# Patient Record
Sex: Female | Born: 1970 | Race: Black or African American | Hispanic: No | Marital: Married
Health system: Southern US, Community
[De-identification: ages and names within clinical notes are randomized; demographics above are authoritative.]

## PROBLEM LIST (undated history)

## (undated) HISTORY — PX: REDUCTION MAMMAPLASTY: SUR839

---

## 1998-09-22 ENCOUNTER — Other Ambulatory Visit: Admission: RE | Admit: 1998-09-22 | Discharge: 1998-09-22 | Payer: Self-pay | Admitting: Obstetrics and Gynecology

## 2000-01-19 ENCOUNTER — Other Ambulatory Visit: Admission: RE | Admit: 2000-01-19 | Discharge: 2000-01-19 | Payer: Self-pay | Admitting: Obstetrics and Gynecology

## 2004-10-23 ENCOUNTER — Emergency Department (HOSPITAL_COMMUNITY): Admission: EM | Admit: 2004-10-23 | Discharge: 2004-10-23 | Payer: Self-pay | Admitting: Family Medicine

## 2005-09-08 ENCOUNTER — Emergency Department (HOSPITAL_COMMUNITY): Admission: EM | Admit: 2005-09-08 | Discharge: 2005-09-08 | Payer: Self-pay | Admitting: Family Medicine

## 2006-10-26 ENCOUNTER — Emergency Department (HOSPITAL_COMMUNITY): Admission: EM | Admit: 2006-10-26 | Discharge: 2006-10-26 | Payer: Self-pay | Admitting: Emergency Medicine

## 2006-11-16 ENCOUNTER — Emergency Department (HOSPITAL_COMMUNITY): Admission: EM | Admit: 2006-11-16 | Discharge: 2006-11-16 | Payer: Self-pay | Admitting: Family Medicine

## 2007-03-10 ENCOUNTER — Emergency Department (HOSPITAL_COMMUNITY): Admission: EM | Admit: 2007-03-10 | Discharge: 2007-03-10 | Payer: Self-pay | Admitting: Emergency Medicine

## 2011-02-21 LAB — CULTURE, ROUTINE-ABSCESS: Gram Stain: NONE SEEN

## 2013-12-28 ENCOUNTER — Other Ambulatory Visit: Payer: Self-pay | Admitting: Endocrinology

## 2013-12-28 DIAGNOSIS — Z1231 Encounter for screening mammogram for malignant neoplasm of breast: Secondary | ICD-10-CM

## 2014-01-13 ENCOUNTER — Ambulatory Visit
Admission: RE | Admit: 2014-01-13 | Discharge: 2014-01-13 | Disposition: A | Discharge status: Home / Self Care | Payer: 59 | Source: Ambulatory Visit | Attending: Endocrinology | Admitting: Endocrinology

## 2014-01-13 DIAGNOSIS — Z1231 Encounter for screening mammogram for malignant neoplasm of breast: Secondary | ICD-10-CM

## 2015-11-10 ENCOUNTER — Other Ambulatory Visit: Payer: Self-pay | Admitting: Endocrinology

## 2015-11-10 ENCOUNTER — Other Ambulatory Visit: Payer: Self-pay | Admitting: Family Medicine

## 2015-11-10 DIAGNOSIS — Z1231 Encounter for screening mammogram for malignant neoplasm of breast: Secondary | ICD-10-CM

## 2015-12-02 ENCOUNTER — Ambulatory Visit
Admission: RE | Admit: 2015-12-02 | Discharge: 2015-12-02 | Disposition: A | Discharge status: Home / Self Care | Payer: 59 | Source: Ambulatory Visit | Attending: Family Medicine | Admitting: Family Medicine

## 2015-12-02 DIAGNOSIS — Z1231 Encounter for screening mammogram for malignant neoplasm of breast: Secondary | ICD-10-CM

## 2016-11-16 ENCOUNTER — Other Ambulatory Visit: Payer: Self-pay | Admitting: Physician Assistant

## 2016-11-16 DIAGNOSIS — Z1231 Encounter for screening mammogram for malignant neoplasm of breast: Secondary | ICD-10-CM

## 2016-12-04 ENCOUNTER — Ambulatory Visit
Admission: RE | Admit: 2016-12-04 | Discharge: 2016-12-04 | Disposition: A | Discharge status: Home / Self Care | Payer: 59 | Source: Ambulatory Visit | Attending: Physician Assistant | Admitting: Physician Assistant

## 2016-12-04 DIAGNOSIS — Z1231 Encounter for screening mammogram for malignant neoplasm of breast: Secondary | ICD-10-CM

## 2018-01-17 ENCOUNTER — Other Ambulatory Visit: Payer: Self-pay | Admitting: Physician Assistant

## 2018-01-17 DIAGNOSIS — Z1231 Encounter for screening mammogram for malignant neoplasm of breast: Secondary | ICD-10-CM

## 2018-02-18 ENCOUNTER — Ambulatory Visit
Admission: RE | Admit: 2018-02-18 | Discharge: 2018-02-18 | Disposition: A | Discharge status: Home / Self Care | Payer: 59 | Source: Ambulatory Visit | Attending: Physician Assistant | Admitting: Physician Assistant

## 2018-02-18 DIAGNOSIS — Z1231 Encounter for screening mammogram for malignant neoplasm of breast: Secondary | ICD-10-CM

## 2019-02-02 ENCOUNTER — Other Ambulatory Visit: Payer: Self-pay | Admitting: Physician Assistant

## 2019-02-02 DIAGNOSIS — Z1231 Encounter for screening mammogram for malignant neoplasm of breast: Secondary | ICD-10-CM

## 2019-02-23 ENCOUNTER — Other Ambulatory Visit: Payer: Self-pay

## 2019-02-23 DIAGNOSIS — Z20822 Contact with and (suspected) exposure to covid-19: Secondary | ICD-10-CM

## 2019-02-24 LAB — NOVEL CORONAVIRUS, NAA: SARS-CoV-2, NAA: NOT DETECTED

## 2019-03-17 ENCOUNTER — Other Ambulatory Visit: Payer: Self-pay

## 2019-03-17 ENCOUNTER — Ambulatory Visit
Admission: RE | Admit: 2019-03-17 | Discharge: 2019-03-17 | Disposition: A | Discharge status: Home / Self Care | Payer: 59 | Source: Ambulatory Visit | Attending: Physician Assistant | Admitting: Physician Assistant

## 2019-03-17 DIAGNOSIS — Z1231 Encounter for screening mammogram for malignant neoplasm of breast: Secondary | ICD-10-CM

## 2019-07-06 ENCOUNTER — Ambulatory Visit: Payer: 59 | Attending: Internal Medicine

## 2019-07-06 DIAGNOSIS — Z20822 Contact with and (suspected) exposure to covid-19: Secondary | ICD-10-CM | POA: Insufficient documentation

## 2019-07-07 LAB — NOVEL CORONAVIRUS, NAA: SARS-CoV-2, NAA: NOT DETECTED

## 2019-12-14 ENCOUNTER — Other Ambulatory Visit: Payer: Self-pay

## 2019-12-14 ENCOUNTER — Ambulatory Visit (INDEPENDENT_AMBULATORY_CARE_PROVIDER_SITE_OTHER): Payer: 59 | Admitting: Podiatry

## 2019-12-14 ENCOUNTER — Ambulatory Visit (INDEPENDENT_AMBULATORY_CARE_PROVIDER_SITE_OTHER): Payer: 59

## 2019-12-14 DIAGNOSIS — M7731 Calcaneal spur, right foot: Secondary | ICD-10-CM

## 2019-12-14 DIAGNOSIS — M79671 Pain in right foot: Secondary | ICD-10-CM

## 2019-12-14 DIAGNOSIS — M21611 Bunion of right foot: Secondary | ICD-10-CM | POA: Diagnosis not present

## 2019-12-14 DIAGNOSIS — M7751 Other enthesopathy of right foot: Secondary | ICD-10-CM | POA: Diagnosis not present

## 2019-12-14 DIAGNOSIS — M7661 Achilles tendinitis, right leg: Secondary | ICD-10-CM

## 2019-12-14 MED ORDER — MELOXICAM 15 MG PO TABS: 15.0000 mg | ORAL_TABLET | Freq: Every day | ORAL | 1 refills | Status: DC

## 2019-12-14 NOTE — Progress Notes (Signed)
   Subjective: 49 y.o. female presents today for evaluation of pain to the right great toe as well as pain to the posterior heel of the right lower extremity.  Patient states she has had pain for the past few months now.  She denies any history of change in activity or injury.  She has not done anything for treatment other than wearing good supportive sneakers.  She likes to walk on the track in the mornings.  She walks approximately 2 miles every morning.   No past medical history on file.    Objective: Physical Exam General: The patient is alert and oriented x3 in no acute distress.  Dermatology: Skin is cool, dry and supple bilateral lower extremities. Negative for open lesions or macerations.  Vascular: Palpable pedal pulses bilaterally. No edema or erythema noted. Capillary refill within normal limits.  Neurological: Epicritic and protective threshold grossly intact bilaterally.   Musculoskeletal Exam: Clinical evidence of bunion deformity noted to the respective foot. There is moderate pain on palpation range of motion of the first MPJ. Lateral deviation of the hallux noted consistent with hallux abductovalgus. There is also pain on palpation to the insertion of the Achilles tendon right lower extremity consistent with insertional Achilles tendinitis.  Radiographic Exam: Increased intermetatarsal angle greater than 15 with a hallux abductus angle greater than 30 noted on AP view. Moderate degenerative changes noted within the first MPJ.  Posterior heel spur noted on lateral view  Assessment: 1. 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  Insertional Achilles tendinitis with posterior heel spur right   Plan of Care:  1. Patient was evaluated. X-Rays reviewed. 2.  Injection of 0.5 cc Celestone Soluspan injected into the first MTPJ right foot as well as the insertion of the Achilles tendon right lower extremity 3.  The patient has not had any symptoms  whatsoever except for within the last 8-10 weeks.  At this time we will pursue conservative treatment 4.  Prescription for meloxicam 15 mg daily 5.  Recommend new shoes at [employer] running store 6.  Return to clinic in 4 weeks  *Works from home for [employer]      Felecia Shelling, [facility]  Dr. Felecia Shelling, DPM    [address]                                        [place], [place] [address]                Office [phone]  Fax [phone]

## 2019-12-16 ENCOUNTER — Ambulatory Visit: Payer: 59 | Admitting: Podiatry

## 2020-01-13 ENCOUNTER — Other Ambulatory Visit: Payer: Self-pay

## 2020-01-13 ENCOUNTER — Ambulatory Visit (INDEPENDENT_AMBULATORY_CARE_PROVIDER_SITE_OTHER): Payer: 59 | Admitting: Podiatry

## 2020-01-13 DIAGNOSIS — M21611 Bunion of right foot: Secondary | ICD-10-CM

## 2020-01-13 DIAGNOSIS — M7751 Other enthesopathy of right foot: Secondary | ICD-10-CM | POA: Diagnosis not present

## 2020-01-13 DIAGNOSIS — M7731 Calcaneal spur, right foot: Secondary | ICD-10-CM | POA: Diagnosis not present

## 2020-01-13 DIAGNOSIS — M7661 Achilles tendinitis, right leg: Secondary | ICD-10-CM | POA: Diagnosis not present

## 2020-01-13 NOTE — Progress Notes (Signed)
   Subjective: 49 y.o. female presents today for follow-up evaluation of plantar fasciitis and Achilles tendinitis to the right lower extremity.  Patient states that she is doing very well and has minimal pain.  The steroidal injections and meloxicam have helped significantly.  She is also wearing good supportive Brooks running shoes that she got at [employer].  No new complaints at this time  No past medical history on file.    Objective: Physical Exam General: The patient is alert and oriented x3 in no acute distress.  Dermatology: Skin is cool, dry and supple bilateral lower extremities. Negative for open lesions or macerations.  Vascular: Palpable pedal pulses bilaterally. No edema or erythema noted. Capillary refill within normal limits.  Neurological: Epicritic and protective threshold grossly intact bilaterally.   Musculoskeletal Exam: Clinical evidence of bunion deformity noted to the respective foot.  Negative for any significant pain on palpation range of motion of the first MPJ. Lateral deviation of the hallux noted consistent with hallux abductovalgus. Negative for pain on palpation to the insertion of the Achilles tendon right lower extremity.  Assessment: 1. 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  Insertional Achilles tendinitis with posterior heel spur right   Plan of Care:  1. Patient was evaluated. 2.  Patient is doing very well today.  Patient has minimal to no pain.  She denies any pain associated to the Achilles tendon. 3.  Continue wearing Brooks running shoes from [employer] 4.  Continue meloxicam as needed 5.  Return to clinic as needed  *Works from home for [employer]      Felecia Shelling, [facility]  Dr. Felecia Shelling, DPM    [address]                                        [place], [place] [address]                Office [phone]  Fax [phone]

## 2020-02-24 ENCOUNTER — Ambulatory Visit (INDEPENDENT_AMBULATORY_CARE_PROVIDER_SITE_OTHER): Payer: 59 | Admitting: Podiatry

## 2020-02-24 ENCOUNTER — Other Ambulatory Visit: Payer: Self-pay

## 2020-02-24 DIAGNOSIS — M7661 Achilles tendinitis, right leg: Secondary | ICD-10-CM | POA: Diagnosis not present

## 2020-02-24 DIAGNOSIS — M7731 Calcaneal spur, right foot: Secondary | ICD-10-CM

## 2020-02-24 MED ORDER — MELOXICAM 15 MG PO TABS: 15.0000 mg | ORAL_TABLET | Freq: Every day | ORAL | 1 refills | Status: AC

## 2020-02-24 NOTE — Progress Notes (Signed)
   Subjective: 49 y.o. female presents today for an acute flareup of Achilles tendinitis to the right lower extremity.  Patient has history of a posterior heel spur.  She states that she was maybe doing some activities at the house and exercising and dancing when it aggravated her Achilles tendon.  She states that the meloxicam that she is taking helps.  She presents for follow-up treatment evaluation  No past medical history on file.    Objective: Physical Exam General: The patient is alert and oriented x3 in no acute distress.  Dermatology: Skin is cool, dry and supple bilateral lower extremities. Negative for open lesions or macerations.  Vascular: Palpable pedal pulses bilaterally. No edema or erythema noted. Capillary refill within normal limits.  Neurological: Epicritic and protective threshold grossly intact bilaterally.   Musculoskeletal Exam: Muscle strength 5/5 all compartments There is also pain on palpation to the insertion of the Achilles tendon right lower extremity consistent with insertional Achilles tendinitis.  Assessment: 1. 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  Insertional Achilles tendinitis with posterior heel spur right   Plan of Care:  1. Patient was evaluated. X-Rays reviewed. 2.  Injection of 0.5 cc Celestone Soluspan injected at the insertion of the Achilles tendon right lower extremity 3.  Refill prescription for meloxicam 15 mg daily 4.  Continue wearing good supportive Brooks running shoes from [employer] 5.  Return to clinic as needed  *Works from home for [employer]      Felecia Shelling, [facility]  Dr. Felecia Shelling, DPM    [address]                                        [place], [place] [address]                Office [phone]  Fax [phone]

## 2020-04-04 ENCOUNTER — Other Ambulatory Visit: Payer: Self-pay | Admitting: Physician Assistant

## 2020-04-04 DIAGNOSIS — Z1231 Encounter for screening mammogram for malignant neoplasm of breast: Secondary | ICD-10-CM

## 2020-04-13 DIAGNOSIS — Z1231 Encounter for screening mammogram for malignant neoplasm of breast: Secondary | ICD-10-CM

## 2020-05-26 ENCOUNTER — Other Ambulatory Visit: Payer: Self-pay

## 2020-05-26 ENCOUNTER — Ambulatory Visit: Payer: 59

## 2020-05-26 ENCOUNTER — Other Ambulatory Visit: Payer: Self-pay | Admitting: Family Medicine

## 2020-05-26 ENCOUNTER — Ambulatory Visit
Admission: RE | Admit: 2020-05-26 | Discharge: 2020-05-26 | Disposition: A | Discharge status: Home / Self Care | Payer: No Typology Code available for payment source | Source: Ambulatory Visit | Attending: Physician Assistant | Admitting: Physician Assistant

## 2020-05-26 DIAGNOSIS — Z1231 Encounter for screening mammogram for malignant neoplasm of breast: Secondary | ICD-10-CM

## 2021-06-27 ENCOUNTER — Other Ambulatory Visit: Payer: Self-pay | Admitting: Family Medicine

## 2021-06-27 DIAGNOSIS — Z1231 Encounter for screening mammogram for malignant neoplasm of breast: Secondary | ICD-10-CM

## 2021-06-29 ENCOUNTER — Ambulatory Visit
Admission: RE | Admit: 2021-06-29 | Discharge: 2021-06-29 | Disposition: A | Discharge status: Home / Self Care | Payer: No Typology Code available for payment source | Source: Ambulatory Visit | Attending: Family Medicine | Admitting: Family Medicine

## 2021-06-29 DIAGNOSIS — Z1231 Encounter for screening mammogram for malignant neoplasm of breast: Secondary | ICD-10-CM

## 2022-10-22 ENCOUNTER — Other Ambulatory Visit: Payer: Self-pay | Admitting: Physician Assistant

## 2022-10-22 DIAGNOSIS — Z1231 Encounter for screening mammogram for malignant neoplasm of breast: Secondary | ICD-10-CM

## 2022-10-26 ENCOUNTER — Ambulatory Visit
Admission: RE | Admit: 2022-10-26 | Discharge: 2022-10-26 | Disposition: A | Discharge status: Home / Self Care | Payer: No Typology Code available for payment source | Source: Ambulatory Visit | Attending: Physician Assistant | Admitting: Physician Assistant

## 2022-10-26 DIAGNOSIS — Z1231 Encounter for screening mammogram for malignant neoplasm of breast: Secondary | ICD-10-CM

## 2023-06-25 IMAGING — MG MM DIGITAL SCREENING BILAT W/ TOMO AND CAD
6 of 10 series · 6 of 30 positions shown · non-contrast
Comparison: Previous exam(s).

ACR Breast Density Category a: The breast tissue is almost entirely
fatty.

CLINICAL DATA: Screening.

EXAM:
DIGITAL SCREENING BILATERAL MAMMOGRAM WITH TOMOSYNTHESIS AND CAD
TECHNIQUE: Bilateral screening digital craniocaudal and mediolateral oblique
mammograms were obtained. Bilateral screening digital breast
tomosynthesis was performed. The images were evaluated with
computer-aided detection.

[R CC synth-2D]
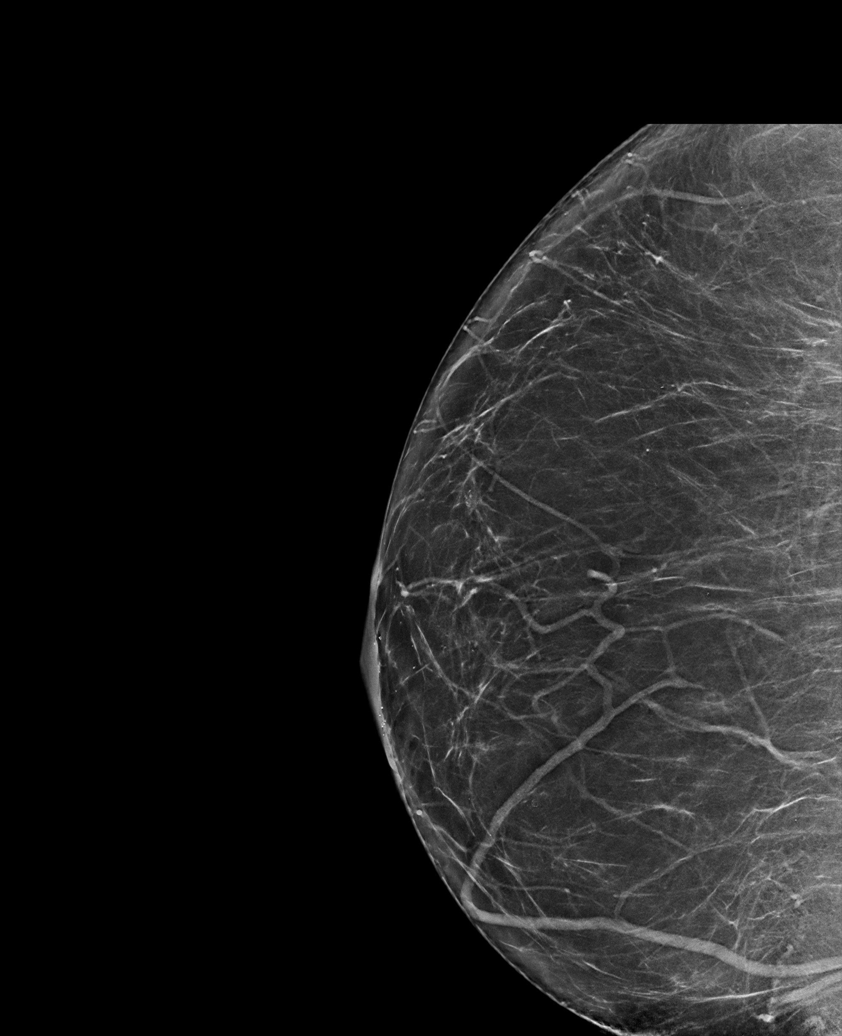

[L MLO synth-2D]
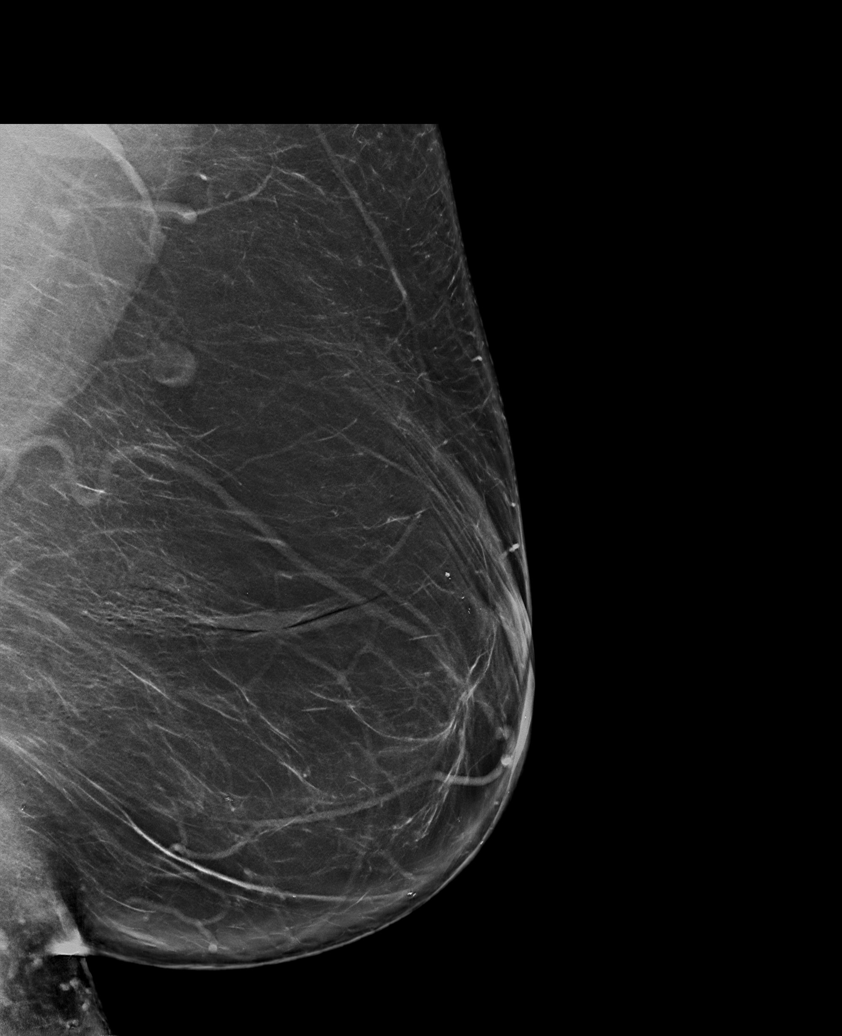

[R MLO synth-2D]
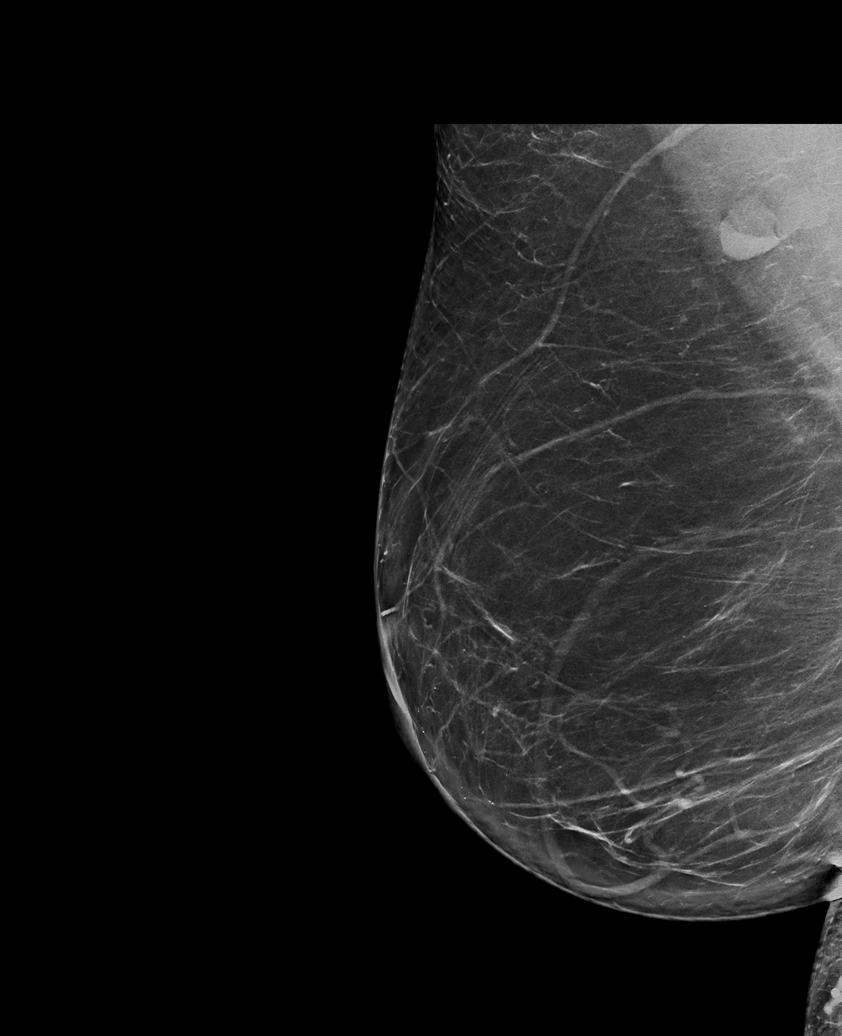

[R CV synth-2D]
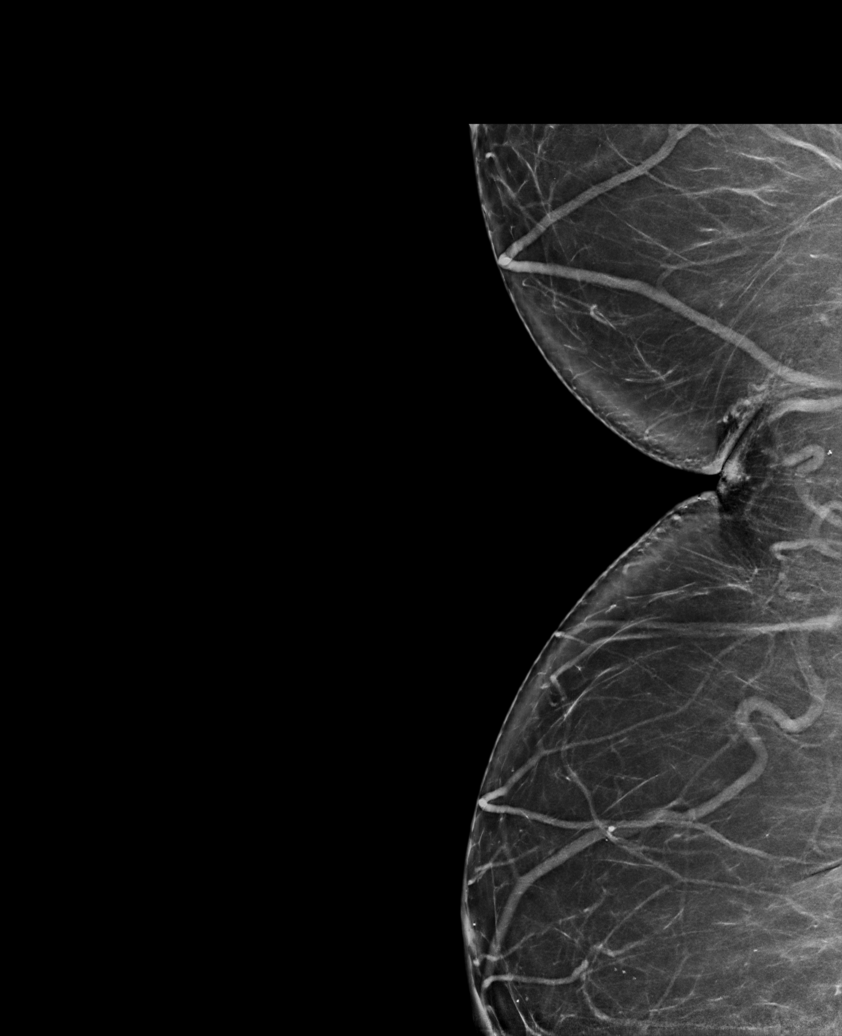

[L CC synth-2D]
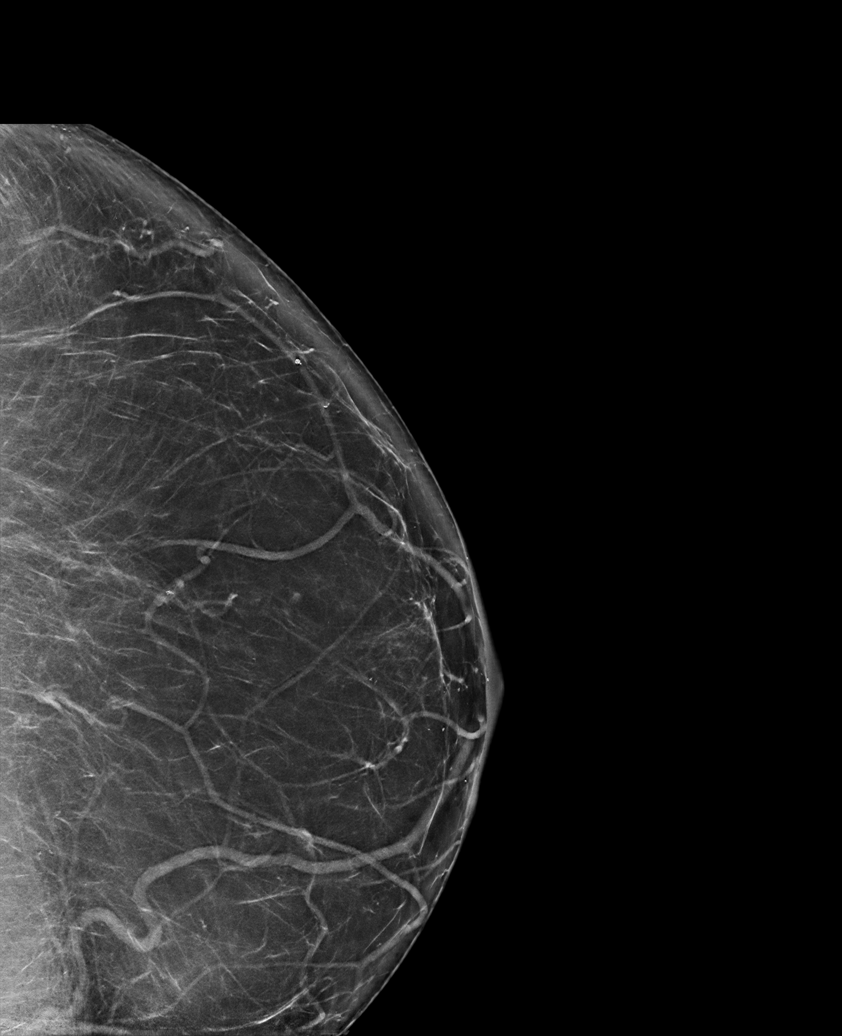

[R CV tomo · tomo slice 40/79.0]
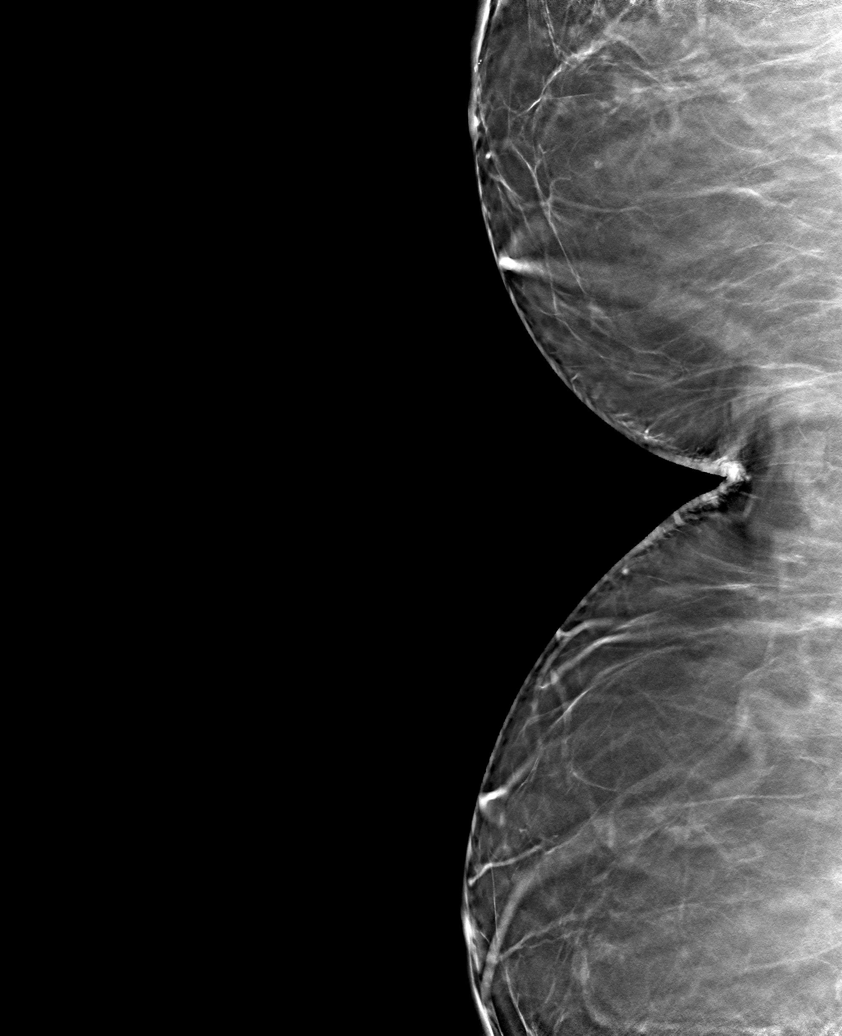

[6 of 30 positions shown; findings below may reference images not displayed]

FINDINGS: There are no findings suspicious for malignancy.
IMPRESSION: No mammographic evidence of malignancy. A result letter of this
screening mammogram will be mailed directly to the patient.

RECOMMENDATION:
Screening mammogram in one year. (Code:0E-3-N98)

BI-RADS CATEGORY  1: Negative.

## 2023-10-21 ENCOUNTER — Other Ambulatory Visit: Payer: Self-pay | Admitting: Nurse Practitioner

## 2023-10-21 DIAGNOSIS — Z1231 Encounter for screening mammogram for malignant neoplasm of breast: Secondary | ICD-10-CM

## 2023-10-28 ENCOUNTER — Ambulatory Visit
Admission: RE | Admit: 2023-10-28 | Discharge: 2023-10-28 | Discharge status: Home / Self Care | Source: Ambulatory Visit | Attending: Nurse Practitioner

## 2023-10-28 ENCOUNTER — Ambulatory Visit

## 2023-10-28 DIAGNOSIS — Z1231 Encounter for screening mammogram for malignant neoplasm of breast: Secondary | ICD-10-CM

## 2023-10-30 ENCOUNTER — Other Ambulatory Visit: Payer: Self-pay | Admitting: Nurse Practitioner

## 2023-10-30 DIAGNOSIS — R928 Other abnormal and inconclusive findings on diagnostic imaging of breast: Secondary | ICD-10-CM

## 2023-11-05 ENCOUNTER — Ambulatory Visit
Admission: RE | Admit: 2023-11-05 | Discharge: 2023-11-05 | Disposition: A | Discharge status: Home / Self Care | Source: Ambulatory Visit | Attending: Nurse Practitioner

## 2023-11-05 ENCOUNTER — Other Ambulatory Visit: Payer: Self-pay | Admitting: Nurse Practitioner

## 2023-11-05 DIAGNOSIS — R928 Other abnormal and inconclusive findings on diagnostic imaging of breast: Secondary | ICD-10-CM

## 2023-11-05 DIAGNOSIS — R921 Mammographic calcification found on diagnostic imaging of breast: Secondary | ICD-10-CM

## 2023-11-11 ENCOUNTER — Ambulatory Visit
Admission: RE | Admit: 2023-11-11 | Discharge: 2023-11-11 | Disposition: A | Discharge status: Home / Self Care | Source: Ambulatory Visit | Attending: Nurse Practitioner | Admitting: Nurse Practitioner

## 2023-11-11 ENCOUNTER — Other Ambulatory Visit: Payer: Self-pay | Admitting: Nurse Practitioner

## 2023-11-11 DIAGNOSIS — R921 Mammographic calcification found on diagnostic imaging of breast: Secondary | ICD-10-CM

## 2023-11-11 DIAGNOSIS — R928 Other abnormal and inconclusive findings on diagnostic imaging of breast: Secondary | ICD-10-CM

## 2023-11-11 HISTORY — PX: BREAST BIOPSY: SHX20

## 2023-11-12 LAB — SURGICAL PATHOLOGY
